# Patient Record
Sex: Female | Born: 2000 | Race: Black or African American | Hispanic: No | Marital: Single | Smoking: Never smoker
Health system: Southern US, Community
[De-identification: ages and names within clinical notes are randomized; demographics above are authoritative.]

## PROBLEM LIST (undated history)

## (undated) DIAGNOSIS — D649 Anemia, unspecified: Secondary | ICD-10-CM

## (undated) DIAGNOSIS — Z862 Personal history of diseases of the blood and blood-forming organs and certain disorders involving the immune mechanism: Secondary | ICD-10-CM

## (undated) DIAGNOSIS — Z7682 Awaiting organ transplant status: Secondary | ICD-10-CM

## (undated) DIAGNOSIS — B2790 Infectious mononucleosis, unspecified without complication: Secondary | ICD-10-CM

## (undated) HISTORY — DX: Awaiting organ transplant status: Z76.82

## (undated) HISTORY — DX: Anemia, unspecified: D64.9

## (undated) HISTORY — PX: OTHER SURGICAL HISTORY: SHX169

---
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
## 2021-03-30 IMAGING — US US MFM OB DETAIL+14 WK
1 series · 13 of 28 positions shown · non-contrast
Comparison: none

[Series 1: us mfm ob detail+14 wk · 65 acquisitions, 13 frames shown]
[im 3/65]
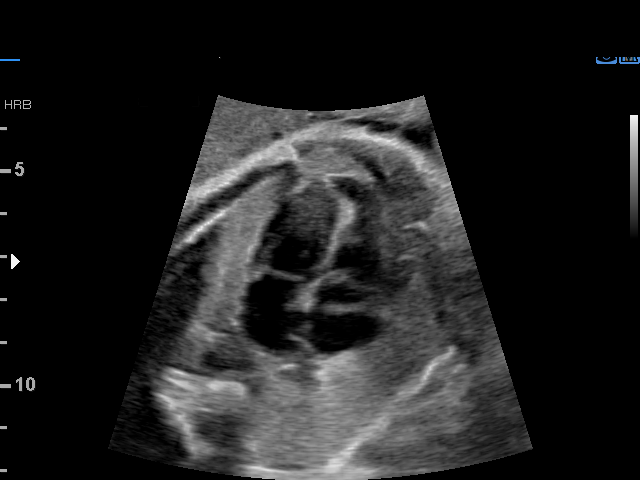
[im 8/65]
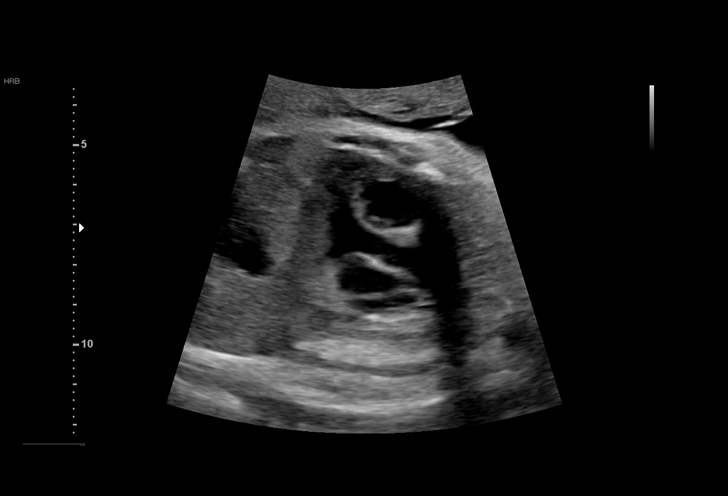
[im 12/65]
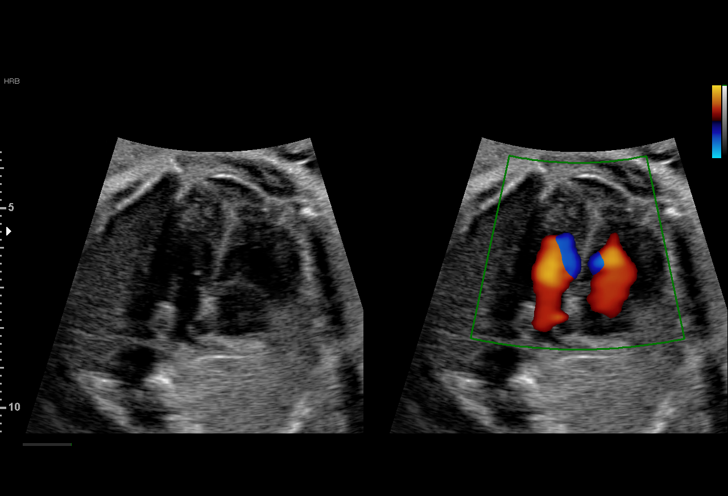
[im 17/65]
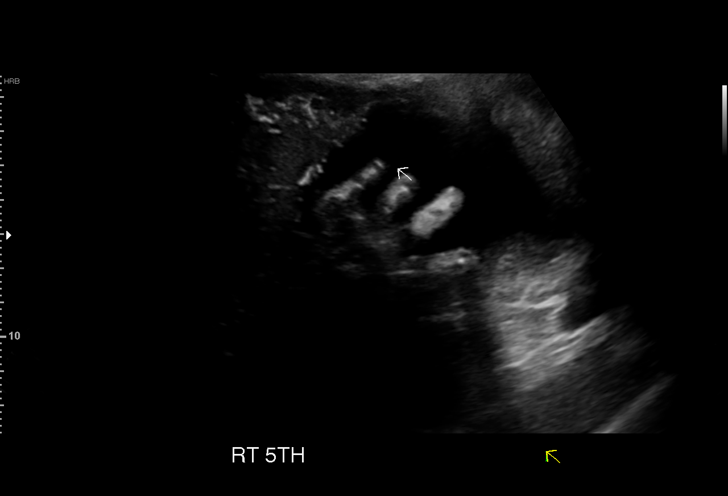
[im 22/65]
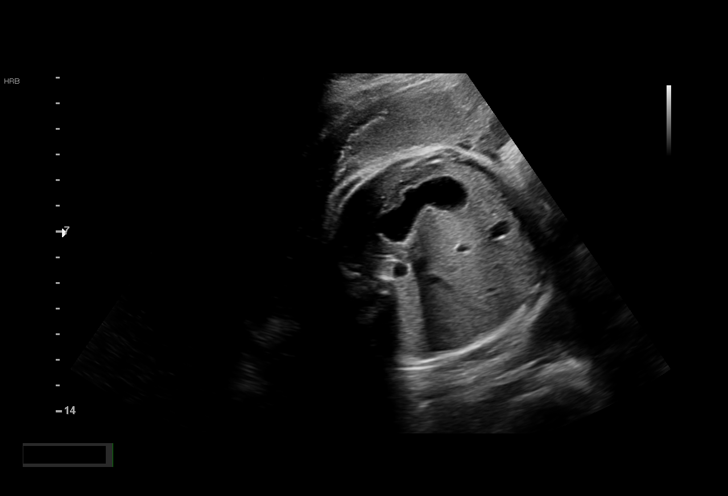
[im 27/65]
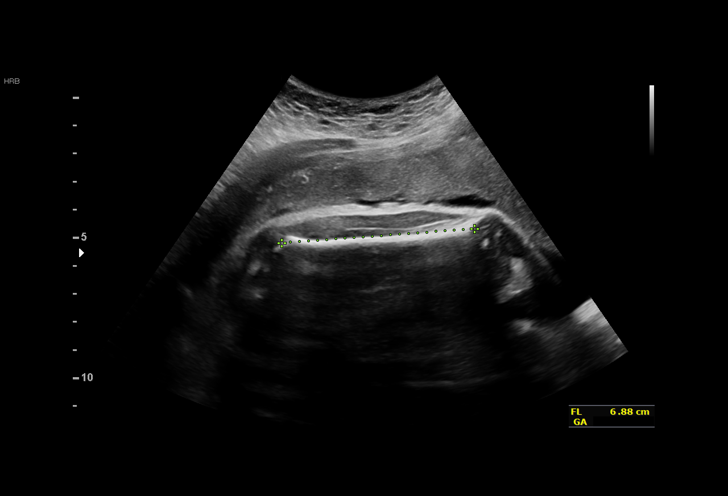
[im 34/65]
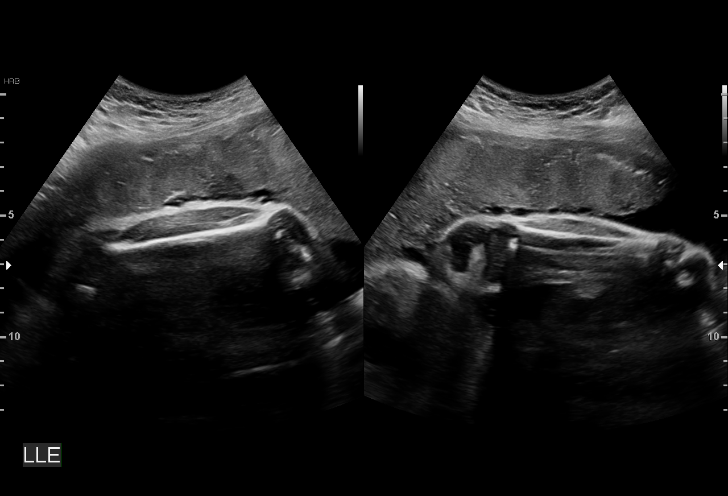
[im 38/65]
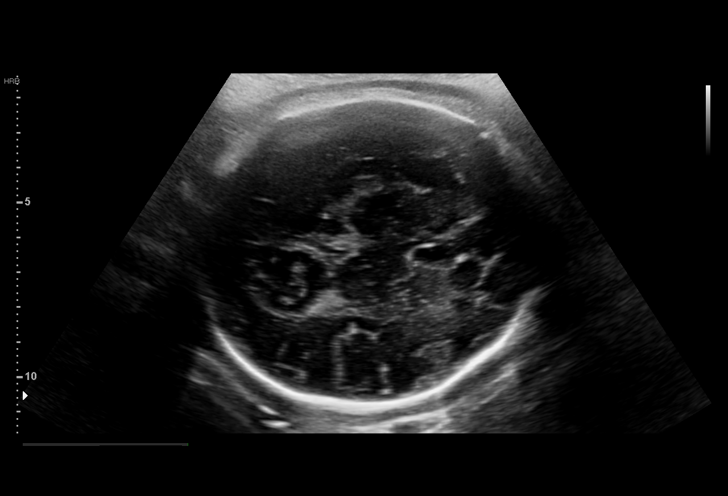
[im 43/65]
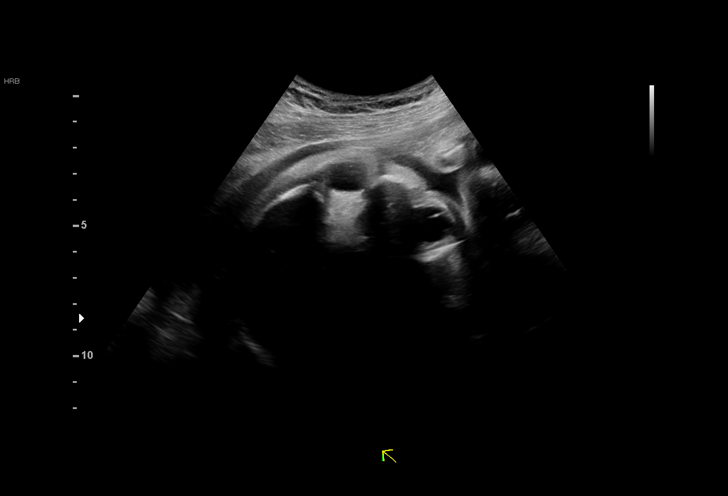
[im 48/65]
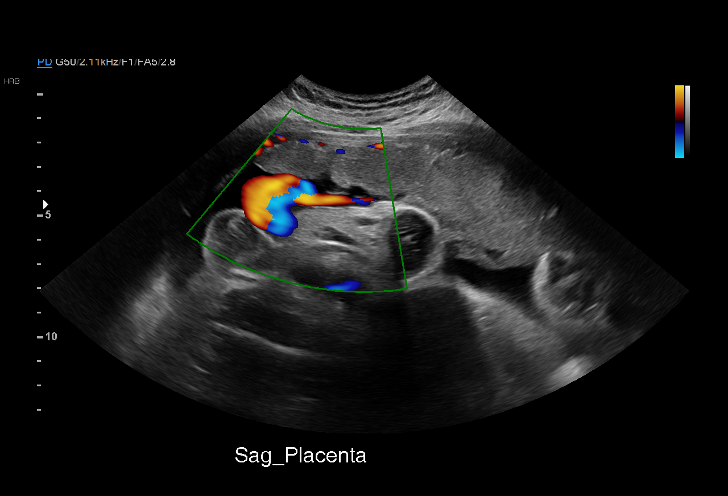
[im 53/65]
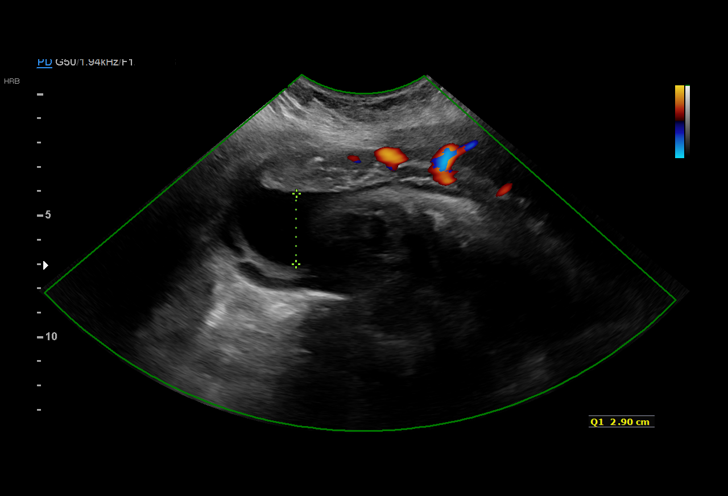
[im 57/65]
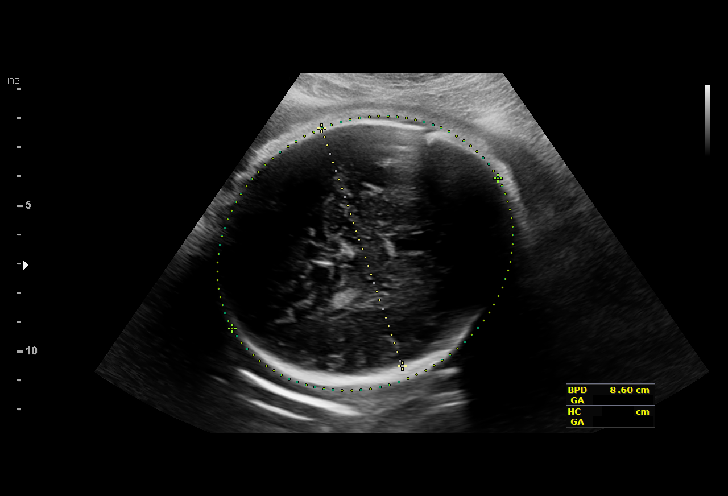
[im 62/65]
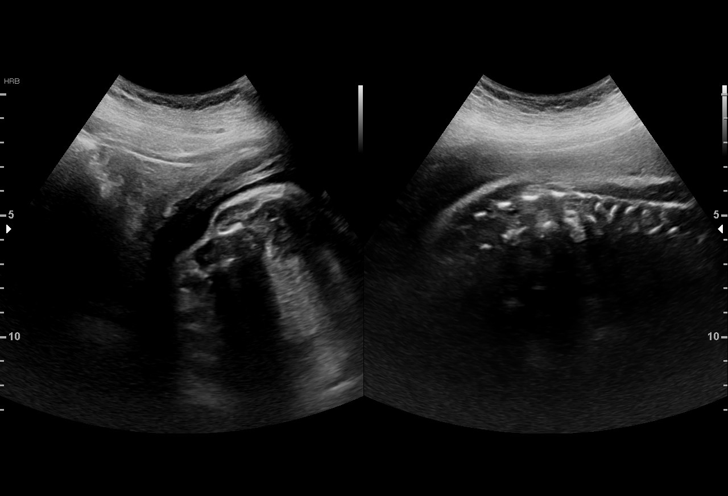

[13 of 28 positions shown; findings below may reference images not displayed]

[REDACTED]. [HOSPITAL],
                   JERFFERSON CNM

 1  US MFM OB DETAIL +14 WK               76811.01    IZOLIME BADUNI

Indications

 Encounter for uncertain dates
 Encounter for antenatal screening for
 malformations
 34 weeks gestation of pregnancy
 Late to prenatal care, third trimester
Fetal Evaluation

 Num Of Fetuses:         1
 Fetal Heart Rate(bpm):  150
 Cardiac Activity:       Observed
 Presentation:           Cephalic
 Placenta:               Anterior
 P. Cord Insertion:      Visualized, central

 Amniotic Fluid
 AFI FV:      Within normal limits

 AFI Sum(cm)     %Tile       Largest Pocket(cm)
 10.85           26

 RUQ(cm)       RLQ(cm)       LUQ(cm)        LLQ(cm)

Biometry

 BPD:      86.6  mm     G. Age:  35w 0d         61  %    CI:        79.08   %    70 - 86
                                                         FL/HC:      22.2   %    20.1 -
 HC:      307.9  mm     G. Age:  34w 2d         13  %    HC/AC:      1.04        0.93 -
 AC:      297.1  mm     G. Age:  33w 5d         29  %    FL/BPD:     78.9   %    71 - 87
 FL:       68.3  mm     G. Age:  35w 1d         55  %    FL/AC:      23.0   %    20 - 24
 CER:      42.4  mm     G. Age:  33w 4d         15  %

 LV:        3.6  mm
 CM:        6.8  mm

 Est. FW:    5278  gm      5 lb 4 oz     36  %
OB History

 Gravidity:    1
Gestational Age

 U/S Today:     34w 4d                                        EDD:   11/03/19
 Best:          34w 4d     Det. By:  U/S (09/26/19)           EDD:   11/03/19
Anatomy

 Cranium:               Appears normal         Aortic Arch:            Not well visualized
 Cavum:                 Appears normal         Ductal Arch:            Appears normal
 Ventricles:            Appears normal         Diaphragm:              Appears normal
 Choroid Plexus:        Appears normal         Stomach:                Appears normal, left
                                                                       sided
 Cerebellum:            Appears normal         Abdomen:                Appears normal
 Posterior Fossa:       Appears normal         Abdominal Wall:         Not well visualized
 Nuchal Fold:           Not applicable (>20    Cord Vessels:           Appears normal (3
                        wks GA)                                        vessel cord)
 Face:                  Appears normal         Kidneys:                Appear normal
                        (orbits and profile)
 Lips:                  Appears normal         Bladder:                Appears normal
 Thoracic:              Appears normal         Spine:                  Ltd views no
                                                                       intracranial signs of
                                                                       NTD
 Heart:                 Appears normal         Upper Extremities:      Appears normal
                        (4CH, axis, and
                        situs)
 RVOT:                  Appears normal         Lower Extremities:      Appears normal
 LVOT:                  Appears normal

 Other:  Right 5th digit visualized. Nasal bone visualized. Open hands
         visualized. Fetus appears to be a male. Technically difficult due to
         advanced GA and fetal position.
Cervix Uterus Adnexa

 Cervix
 Not visualized (advanced GA >89wks)

 Uterus
 No abnormality visualized.
Impression

 Late prenatal care. Clinical examination at the [facility]
 week, the gestational age was assessed around 35 weeks.
 reports no chronic medical conditions.

 On ultrasound, the composite fetal biometry is consistent with
 34 weeks and 4 days gestation.Amniotic fluid is normal and
 good fetal activity is seen . Fetal anatomy appears normal but
 limited by advanced gestational age.
 We have assigned the EDD at 11/03/2019.

 I encouraged her to screen for gestational diabetes. Blood
 pressure today at her office is 127/84 mmHg.
Recommendations

 -An appointment was made for her to return in 3 weeks for
 interval fetal growth assessment.
                 Tzul, Yoly

## 2021-09-12 DIAGNOSIS — Z114 Encounter for screening for human immunodeficiency virus [HIV]: Secondary | ICD-10-CM | POA: Diagnosis not present

## 2021-09-12 DIAGNOSIS — Z1159 Encounter for screening for other viral diseases: Secondary | ICD-10-CM | POA: Diagnosis not present

## 2021-09-12 DIAGNOSIS — Z01419 Encounter for gynecological examination (general) (routine) without abnormal findings: Secondary | ICD-10-CM | POA: Diagnosis not present

## 2021-09-12 DIAGNOSIS — Z113 Encounter for screening for infections with a predominantly sexual mode of transmission: Secondary | ICD-10-CM | POA: Diagnosis not present

## 2021-09-22 DIAGNOSIS — N6489 Other specified disorders of breast: Secondary | ICD-10-CM | POA: Diagnosis not present

## 2021-09-22 DIAGNOSIS — N6320 Unspecified lump in the left breast, unspecified quadrant: Secondary | ICD-10-CM | POA: Diagnosis not present

## 2022-02-23 DIAGNOSIS — Z923 Personal history of irradiation: Secondary | ICD-10-CM | POA: Diagnosis not present

## 2022-02-23 DIAGNOSIS — Z8572 Personal history of non-Hodgkin lymphomas: Secondary | ICD-10-CM | POA: Diagnosis not present

## 2022-02-23 DIAGNOSIS — D619 Aplastic anemia, unspecified: Secondary | ICD-10-CM | POA: Diagnosis not present

## 2022-02-23 DIAGNOSIS — Z48290 Encounter for aftercare following bone marrow transplant: Secondary | ICD-10-CM | POA: Diagnosis not present

## 2022-02-23 DIAGNOSIS — C8338 Diffuse large B-cell lymphoma, lymph nodes of multiple sites: Secondary | ICD-10-CM | POA: Diagnosis not present

## 2022-02-23 DIAGNOSIS — Z9481 Bone marrow transplant status: Secondary | ICD-10-CM | POA: Diagnosis not present

## 2022-02-23 DIAGNOSIS — Z79899 Other long term (current) drug therapy: Secondary | ICD-10-CM | POA: Diagnosis not present

## 2022-03-13 IMAGING — DX DG CHEST 2V
2 series · 2 of 2 positions shown · non-contrast
Comparison: None

CLINICAL DATA: Productive cough for 1 month

EXAM:
CHEST - 2 VIEW

[dg chest 2 view (1 of 2)]
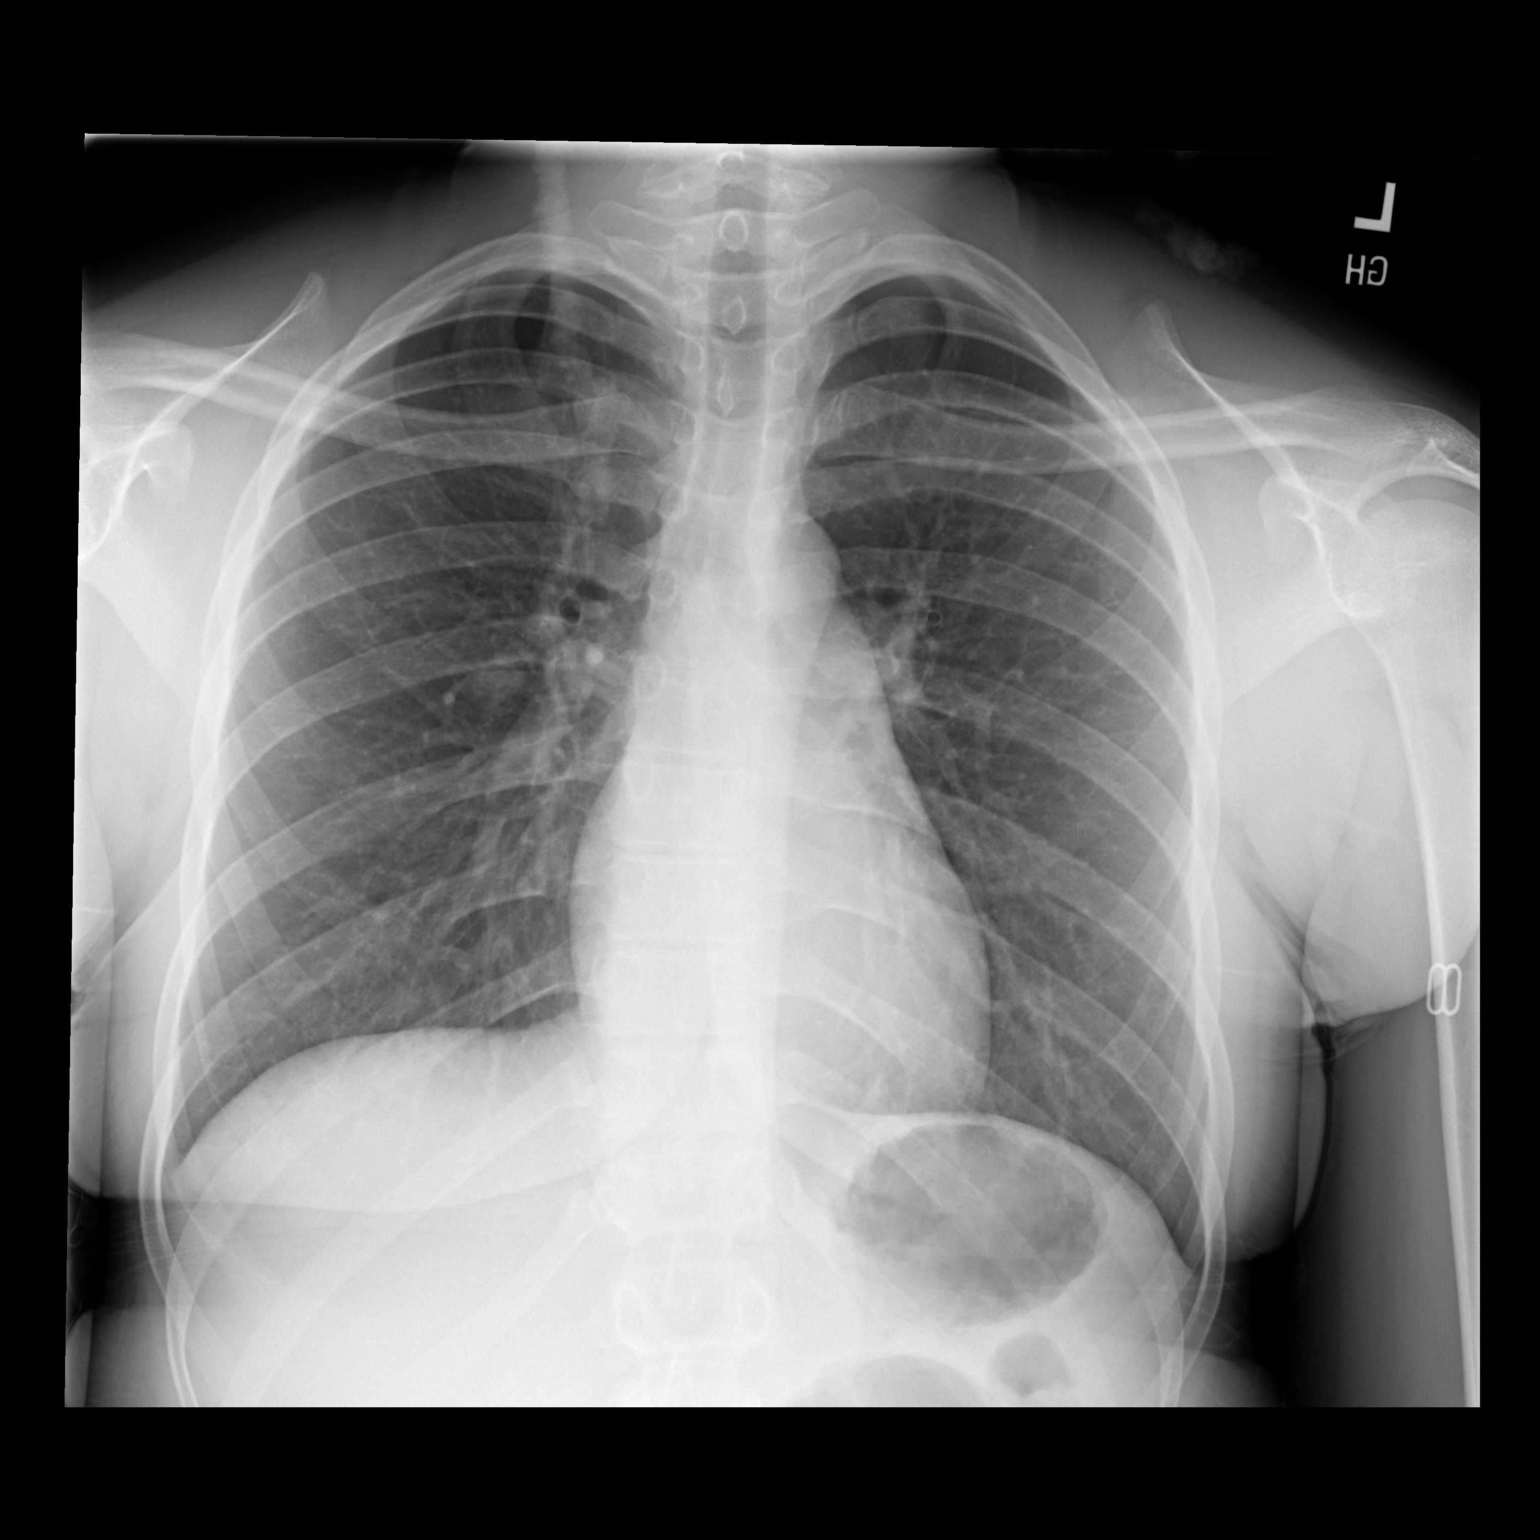

[dg chest 2 view (2 of 2)]
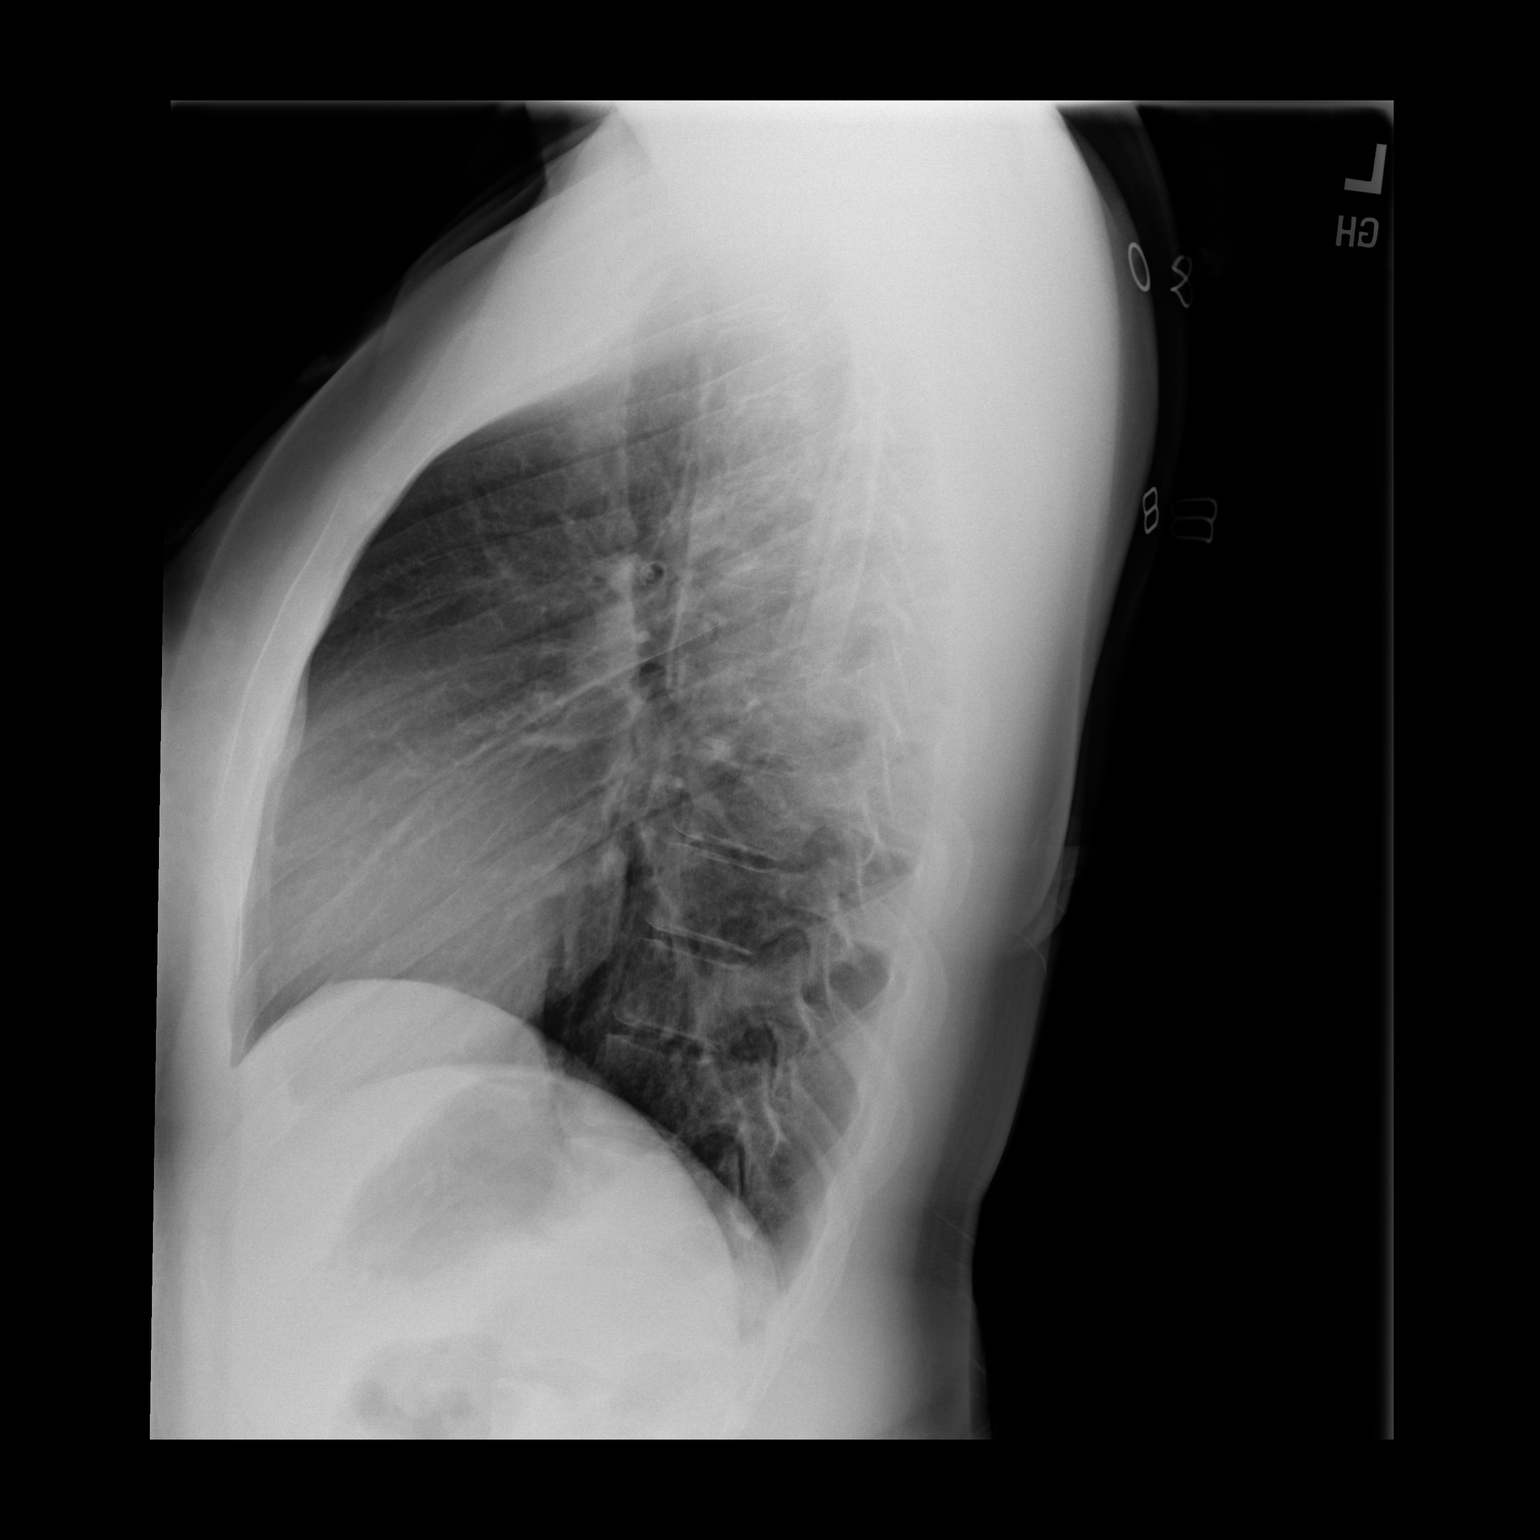

[2 of 2 positions shown; findings below may reference images not displayed]

FINDINGS: Normal heart size, mediastinal contours, and pulmonary vascularity.

Mild peribronchial thickening.

No pulmonary infiltrate, pleural effusion, or pneumothorax.

Bones unremarkable.
IMPRESSION: Mild bronchitic changes without infiltrate.

## 2022-11-22 DIAGNOSIS — N760 Acute vaginitis: Secondary | ICD-10-CM | POA: Diagnosis not present

## 2022-11-22 DIAGNOSIS — N898 Other specified noninflammatory disorders of vagina: Secondary | ICD-10-CM | POA: Diagnosis not present

## 2022-11-22 DIAGNOSIS — Z30431 Encounter for routine checking of intrauterine contraceptive device: Secondary | ICD-10-CM | POA: Diagnosis not present

## 2022-11-22 DIAGNOSIS — Z975 Presence of (intrauterine) contraceptive device: Secondary | ICD-10-CM | POA: Diagnosis not present

## 2022-11-22 DIAGNOSIS — N921 Excessive and frequent menstruation with irregular cycle: Secondary | ICD-10-CM | POA: Diagnosis not present

## 2022-11-29 DIAGNOSIS — Z113 Encounter for screening for infections with a predominantly sexual mode of transmission: Secondary | ICD-10-CM | POA: Diagnosis not present

## 2022-11-29 DIAGNOSIS — Z1159 Encounter for screening for other viral diseases: Secondary | ICD-10-CM | POA: Diagnosis not present

## 2022-11-29 DIAGNOSIS — Z114 Encounter for screening for human immunodeficiency virus [HIV]: Secondary | ICD-10-CM | POA: Diagnosis not present

## 2023-10-02 DIAGNOSIS — J029 Acute pharyngitis, unspecified: Secondary | ICD-10-CM | POA: Diagnosis not present

## 2023-10-02 DIAGNOSIS — Z6829 Body mass index (BMI) 29.0-29.9, adult: Secondary | ICD-10-CM | POA: Diagnosis not present

## 2023-10-04 ENCOUNTER — Ambulatory Visit
Admission: EM | Admit: 2023-10-04 | Discharge: 2023-10-04 | Disposition: A | Discharge status: Home / Self Care | Attending: Family Medicine | Admitting: Family Medicine

## 2023-10-04 DIAGNOSIS — J029 Acute pharyngitis, unspecified: Secondary | ICD-10-CM | POA: Diagnosis not present

## 2023-10-04 HISTORY — DX: Infectious mononucleosis, unspecified without complication: B27.90

## 2023-10-04 MED ORDER — IBUPROFEN 800 MG PO TABS: 800.0000 mg | ORAL_TABLET | Freq: Three times a day (TID) | ORAL | 0 refills | Status: AC

## 2023-10-04 MED ORDER — AMOXICILLIN 875 MG PO TABS: 875.0000 mg | ORAL_TABLET | Freq: Two times a day (BID) | ORAL | 0 refills | Status: AC

## 2023-10-04 NOTE — ED Triage Notes (Signed)
 Pt c/o sore throat and left earache x 1 week-was seen by PCP 2 days ago-had a neg strep-no rx meds-NAD-steady gait

## 2023-10-04 NOTE — ED Provider Notes (Signed)
 [facility]  Note:  This document was prepared using Conservation officer, historic buildings and may include unintentional dictation errors.  MRN: [id number] DOB: 2000-11-06  Subjective:   April Carr is a 23 y.o. female presenting for 1 week history of throat pain, painful swallowing, left ear pain, fever, difficulty controlling secretions, opening mouth, swallowing. Had a negative strep test already. No concern or risk for STI of the throat.   No current facility-administered medications for this encounter.  Current Outpatient Medications:    acetaminophen  (TYLENOL ) 325 MG tablet, Take 2 tablets (650 mg total) by mouth every 4 (four) hours as needed (for pain scale < 4)., Disp: 60 tablet, Rfl: 0   ibuprofen  (ADVIL ) 600 MG tablet, Take 1 tablet (600 mg total) by mouth every 6 (six) hours., Disp: 30 tablet, Rfl: 0   levothyroxine  (SYNTHROID ) 50 MCG tablet, Take 50 mcg by mouth daily. 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    Appearance: Normal appearance. She is well-developed. She is not ill-appearing,  toxic-appearing or diaphoretic.  HENT:     Head: Normocephalic and atraumatic.     Nose: Nose normal.     Mouth/Throat:     Mouth: Mucous membranes are moist.     Pharynx: Pharyngeal swelling, oropharyngeal exudate and posterior oropharyngeal erythema (left sided) present. No uvula swelling.     Tonsils: Tonsillar exudate present. No tonsillar abscesses. 0 on the right. 1+ on the left.  Eyes:     General: No scleral icterus.       Right eye: No discharge.        Left eye: No discharge.     Extraocular Movements: Extraocular movements intact.  Cardiovascular:     Rate and Rhythm: Normal rate.  Pulmonary:     Effort: Pulmonary effort is normal.  Skin:    General: Skin is warm and dry.  Neurological:     General: No focal deficit present.     Mental Status: She is alert and oriented to person, place, and time.  Psychiatric:        Mood and Affect: Mood normal.        Behavior: Behavior normal.     Assessment and Plan :   PDMP not reviewed this encounter.  1. Acute pharyngitis, unspecified etiology    Will treat empirically for  pharyngitis given physical exam findings.  Patient is to start amoxicillin , use supportive care otherwise. Counseled patient on potential for adverse effects with medications prescribed/recommended today, ER and return-to-clinic precautions discussed, patient verbalized understanding.    Christopher Savannah, [place] 10/04/23 1942

## 2023-10-12 DIAGNOSIS — J029 Acute pharyngitis, unspecified: Secondary | ICD-10-CM | POA: Diagnosis not present

## 2023-10-12 DIAGNOSIS — R509 Fever, unspecified: Secondary | ICD-10-CM | POA: Diagnosis not present
